# Patient Record
Sex: Male | Born: 1970 | Race: White | Hispanic: No | Smoking: Current every day smoker
Health system: Southern US, Community
[De-identification: ages and names within clinical notes are randomized; demographics above are authoritative.]

---

## 2002-01-01 ENCOUNTER — Encounter: Payer: Self-pay | Admitting: Emergency Medicine

## 2002-01-01 ENCOUNTER — Emergency Department (HOSPITAL_COMMUNITY): Admission: EM | Admit: 2002-01-01 | Discharge: 2002-01-01 | Payer: Self-pay | Admitting: Emergency Medicine

## 2005-05-25 ENCOUNTER — Observation Stay (HOSPITAL_COMMUNITY): Admission: EM | Admit: 2005-05-25 | Discharge: 2005-05-26 | Payer: Self-pay | Admitting: Emergency Medicine

## 2005-05-25 ENCOUNTER — Ambulatory Visit: Payer: Self-pay | Admitting: Internal Medicine

## 2005-05-26 ENCOUNTER — Ambulatory Visit: Payer: Self-pay | Admitting: Cardiovascular Disease

## 2005-06-04 ENCOUNTER — Ambulatory Visit: Payer: Self-pay | Admitting: Internal Medicine

## 2015-12-04 ENCOUNTER — Encounter: Payer: Self-pay | Admitting: Family Medicine

## 2017-01-27 DIAGNOSIS — Z6829 Body mass index (BMI) 29.0-29.9, adult: Secondary | ICD-10-CM | POA: Diagnosis not present

## 2017-01-27 DIAGNOSIS — F438 Other reactions to severe stress: Secondary | ICD-10-CM | POA: Diagnosis not present

## 2017-01-27 DIAGNOSIS — F988 Other specified behavioral and emotional disorders with onset usually occurring in childhood and adolescence: Secondary | ICD-10-CM | POA: Diagnosis not present

## 2017-06-06 DIAGNOSIS — Z13220 Encounter for screening for lipoid disorders: Secondary | ICD-10-CM | POA: Diagnosis not present

## 2017-06-06 DIAGNOSIS — Z0000 Encounter for general adult medical examination without abnormal findings: Secondary | ICD-10-CM | POA: Diagnosis not present

## 2017-06-06 DIAGNOSIS — E669 Obesity, unspecified: Secondary | ICD-10-CM | POA: Diagnosis not present

## 2017-06-06 DIAGNOSIS — Z131 Encounter for screening for diabetes mellitus: Secondary | ICD-10-CM | POA: Diagnosis not present

## 2017-06-06 DIAGNOSIS — Z6831 Body mass index (BMI) 31.0-31.9, adult: Secondary | ICD-10-CM | POA: Diagnosis not present

## 2017-06-16 DIAGNOSIS — R21 Rash and other nonspecific skin eruption: Secondary | ICD-10-CM | POA: Diagnosis not present

## 2017-06-16 DIAGNOSIS — A932 Colorado tick fever: Secondary | ICD-10-CM | POA: Diagnosis not present

## 2018-01-04 DIAGNOSIS — Z6830 Body mass index (BMI) 30.0-30.9, adult: Secondary | ICD-10-CM | POA: Diagnosis not present

## 2018-01-04 DIAGNOSIS — Z23 Encounter for immunization: Secondary | ICD-10-CM | POA: Diagnosis not present

## 2018-01-04 DIAGNOSIS — E669 Obesity, unspecified: Secondary | ICD-10-CM | POA: Diagnosis not present

## 2018-01-04 DIAGNOSIS — F988 Other specified behavioral and emotional disorders with onset usually occurring in childhood and adolescence: Secondary | ICD-10-CM | POA: Diagnosis not present

## 2018-01-04 DIAGNOSIS — F438 Other reactions to severe stress: Secondary | ICD-10-CM | POA: Diagnosis not present

## 2018-01-04 DIAGNOSIS — F17200 Nicotine dependence, unspecified, uncomplicated: Secondary | ICD-10-CM | POA: Diagnosis not present

## 2018-03-25 DIAGNOSIS — J0100 Acute maxillary sinusitis, unspecified: Secondary | ICD-10-CM | POA: Diagnosis not present

## 2018-06-09 DIAGNOSIS — Z0000 Encounter for general adult medical examination without abnormal findings: Secondary | ICD-10-CM | POA: Diagnosis not present

## 2018-06-09 DIAGNOSIS — Z13220 Encounter for screening for lipoid disorders: Secondary | ICD-10-CM | POA: Diagnosis not present

## 2018-06-09 DIAGNOSIS — E663 Overweight: Secondary | ICD-10-CM | POA: Diagnosis not present

## 2018-06-09 DIAGNOSIS — Z131 Encounter for screening for diabetes mellitus: Secondary | ICD-10-CM | POA: Diagnosis not present

## 2018-06-09 DIAGNOSIS — Z6829 Body mass index (BMI) 29.0-29.9, adult: Secondary | ICD-10-CM | POA: Diagnosis not present

## 2018-07-21 DIAGNOSIS — M542 Cervicalgia: Secondary | ICD-10-CM | POA: Diagnosis not present

## 2021-03-05 DIAGNOSIS — Z720 Tobacco use: Secondary | ICD-10-CM | POA: Diagnosis not present

## 2021-03-30 DIAGNOSIS — H6692 Otitis media, unspecified, left ear: Secondary | ICD-10-CM | POA: Diagnosis not present

## 2021-03-30 DIAGNOSIS — J189 Pneumonia, unspecified organism: Secondary | ICD-10-CM | POA: Diagnosis not present

## 2021-04-15 DIAGNOSIS — J329 Chronic sinusitis, unspecified: Secondary | ICD-10-CM | POA: Diagnosis not present

## 2021-04-15 DIAGNOSIS — Z20828 Contact with and (suspected) exposure to other viral communicable diseases: Secondary | ICD-10-CM | POA: Diagnosis not present

## 2021-04-15 DIAGNOSIS — J40 Bronchitis, not specified as acute or chronic: Secondary | ICD-10-CM | POA: Diagnosis not present

## 2021-04-15 DIAGNOSIS — I10 Essential (primary) hypertension: Secondary | ICD-10-CM | POA: Diagnosis not present

## 2021-11-29 DIAGNOSIS — R1031 Right lower quadrant pain: Secondary | ICD-10-CM | POA: Diagnosis not present

## 2021-11-29 DIAGNOSIS — R1084 Generalized abdominal pain: Secondary | ICD-10-CM | POA: Diagnosis not present

## 2021-12-03 ENCOUNTER — Encounter: Payer: Self-pay | Admitting: Emergency Medicine

## 2021-12-03 ENCOUNTER — Ambulatory Visit
Admission: EM | Admit: 2021-12-03 | Discharge: 2021-12-03 | Disposition: A | Discharge status: Home / Self Care | Payer: BC Managed Care – PPO | Attending: Emergency Medicine | Admitting: Emergency Medicine

## 2021-12-03 ENCOUNTER — Ambulatory Visit (INDEPENDENT_AMBULATORY_CARE_PROVIDER_SITE_OTHER): Payer: BC Managed Care – PPO

## 2021-12-03 DIAGNOSIS — R1031 Right lower quadrant pain: Secondary | ICD-10-CM | POA: Insufficient documentation

## 2021-12-03 DIAGNOSIS — R109 Unspecified abdominal pain: Secondary | ICD-10-CM | POA: Diagnosis not present

## 2021-12-03 LAB — COMPREHENSIVE METABOLIC PANEL
ALT: 28 U/L (ref 0–44)
AST: 21 U/L (ref 15–41)
Albumin: 4.7 g/dL (ref 3.5–5.0)
Alkaline Phosphatase: 63 U/L (ref 38–126)
Anion gap: 6 (ref 5–15)
BUN: 13 mg/dL (ref 6–20)
CO2: 27 mmol/L (ref 22–32)
Calcium: 9.5 mg/dL (ref 8.9–10.3)
Chloride: 106 mmol/L (ref 98–111)
Creatinine, Ser: 1.02 mg/dL (ref 0.61–1.24)
GFR, Estimated: >60 mL/min (ref >60)
Glucose, Bld: 106 mg/dL — ABNORMAL HIGH (ref 70–99)
Potassium: 4.6 mmol/L (ref 3.5–5.1)
Sodium: 139 mmol/L (ref 135–145)
Total Bilirubin: 0.5 mg/dL (ref 0.3–1.2)
Total Protein: 8 g/dL (ref 6.5–8.1)

## 2021-12-03 LAB — CBC WITH DIFFERENTIAL/PLATELET
Abs Immature Granulocytes: 0.02 10*3/uL (ref 0.00–0.07)
Basophils Absolute: 0.1 10*3/uL (ref 0.0–0.1)
Basophils Relative: 1 %
Eosinophils Absolute: 0.1 10*3/uL (ref 0.0–0.5)
Eosinophils Relative: 1 %
HCT: 50.2 % (ref 39.0–52.0)
Hemoglobin: 16.7 g/dL (ref 13.0–17.0)
Immature Granulocytes: 0 %
Lymphocytes Relative: 29 %
Lymphs Abs: 2.7 10*3/uL (ref 0.7–4.0)
MCH: 27.8 pg (ref 26.0–34.0)
MCHC: 33.3 g/dL (ref 30.0–36.0)
MCV: 83.5 fL (ref 80.0–100.0)
Monocytes Absolute: 0.6 10*3/uL (ref 0.1–1.0)
Monocytes Relative: 7 %
Neutro Abs: 5.7 10*3/uL (ref 1.7–7.7)
Neutrophils Relative %: 62 %
Platelets: 349 10*3/uL (ref 150–400)
RBC: 6.01 MIL/uL — ABNORMAL HIGH (ref 4.22–5.81)
RDW: 14.3 % (ref 11.5–15.5)
WBC: 9.1 10*3/uL (ref 4.0–10.5)
nRBC: 0 % (ref 0.0–0.2)

## 2021-12-03 LAB — URINALYSIS, ROUTINE W REFLEX MICROSCOPIC
Bilirubin Urine: NEGATIVE
Glucose, UA: NEGATIVE mg/dL
Hgb urine dipstick: NEGATIVE
Ketones, ur: NEGATIVE mg/dL
Leukocytes,Ua: NEGATIVE
Nitrite: NEGATIVE
Protein, ur: NEGATIVE mg/dL
Specific Gravity, Urine: 1.015 (ref 1.005–1.030)
pH: 7 (ref 5.0–8.0)

## 2021-12-03 MED ORDER — IBUPROFEN 600 MG PO TABS: 600.0000 mg | ORAL_TABLET | Freq: Four times a day (QID) | ORAL | 0 refills | Status: AC | PRN

## 2021-12-03 MED ORDER — TRAMADOL HCL 50 MG PO TABS: 100.0000 mg | ORAL_TABLET | Freq: Four times a day (QID) | ORAL | 0 refills | Status: AC | PRN

## 2021-12-03 MED ORDER — IOHEXOL 300 MG/ML  SOLN
100.0000 mL | Freq: Once | INTRAMUSCULAR | Status: AC | PRN
  Administered 2021-12-03: 100 mL via INTRAVENOUS

## 2021-12-03 NOTE — ED Triage Notes (Signed)
Pt presents with RLQ pain for 2 weeks. He went to an urgent care in [place] and advised they will contact him about his bloodwork. He received a call that his calcium levels were elevated. He is here to see what he should do.

## 2021-12-03 NOTE — Discharge Instructions (Addendum)
Your blood work and CT scan were very reassuring.  The CT scan did show that you have scattered colonic diverticulosis but no active infection or diverticulitis.  It does show that your appendix is normal.  No other inflammatory process was noted within your abdomen and all of your organ structures look normal.  You may be experiencing pain as a result of adhesions from your previous back surgery.  These are difficult to detect on CT scan unless they are causing an issue, such as a bowel obstruction, which they are not at present.  Use the 600 mg ibuprofen I am prescribing every 6 hours as needed for pain.  Take it with food.  If you have severe pain you can take the tramadol that I prescribed.  Be mindful that this will make you drowsy so do not operate heavy machinery or drive if you take it.  Also do not drink alcohol if you take it.  If your symptoms continue I would follow-up with your primary care provider and ask for referral to general surgery to be evaluated for possible adhesions.

## 2021-12-03 NOTE — ED Provider Notes (Signed)
[facility]    CSN: [id number] Arrival date & time: 12/03/21  [id number]      History   Chief Complaint Chief Complaint  Patient presents with   Abdominal Pain    HPI Hunter Gentry is a 51 y.o. male.   HPI  51 year old male here for evaluation right lower quadrant abdominal pain.  The patient reports that he has been experiencing pain in the right lower quadrant of his abdomen for the last 2 weeks.  It initially started as an off-and-on pain which originally woke him from sleep.  It is since going into a constant sharp pain.  This is associated with intermittent nausea he is also had intermittent dysuria.  He denies any fever, changes to appetite, vomiting, urinary urgency or frequency, hematuria, or changes to his urine stream.  He still has his appendix.  He has had a back fusion surgery in the past but no other surgical procedures.  He was previously evaluated at an urgent care in [place] and had a CMP that showed an elevated calcium.  These labs are not available in epic.  History reviewed. No pertinent past medical history.  There are no problems to display for this patient.   History reviewed. No pertinent surgical history.     Home Medications    Prior to Admission medications   Medication Sig Start Date End Date Taking? Authorizing Provider  ibuprofen (ADVIL) 600 MG tablet Take 1 tablet (600 mg total) by mouth every 6 (six) hours as needed. 12/03/21  Yes Margarette Canada, NP  traMADol (ULTRAM) 50 MG tablet Take 2 tablets (100 mg total) by mouth every 6 (six) hours as needed. 12/03/21  Yes Margarette Canada, NP    Family History History reviewed. No pertinent family history.  Social History Social History   Tobacco Use   Smoking status: Every Day    Types: Cigarettes   Smokeless tobacco: Never  Vaping Use   Vaping Use: Never used  Substance Use Topics   Alcohol use: Never   Drug use: Never     Allergies   Patient has no known  allergies.   Review of Systems Review of Systems  Constitutional:  Negative for fever.  Gastrointestinal:  Positive for abdominal pain and nausea. Negative for diarrhea and vomiting.  Genitourinary:  Positive for dysuria. Negative for decreased urine volume, difficulty urinating, frequency and urgency.  Musculoskeletal:  Positive for back pain.  Skin:  Negative for rash.  Hematological: Negative.   Psychiatric/Behavioral: Negative.       Physical Exam Triage Vital Signs ED Triage Vitals  Enc Vitals Group     BP      Pulse      Resp      Temp      Temp src      SpO2      Weight      Height      Head Circumference      Peak Flow      Pain Score      Pain Loc      Pain Edu?      Excl. in [place]?    No data found.  Updated Vital Signs BP (!) 155/109 (BP Location: Right Arm)   Pulse 96   Temp 98.4 F (36.9 C) (Oral)   Resp 18   SpO2 96%   Visual Acuity Right Eye Distance:   Left Eye Distance:   Bilateral Distance:    Right Eye Near:   Left Eye  Near:    Bilateral Near:     Physical Exam Vitals and nursing note reviewed.  Constitutional:      Appearance: Normal appearance.  HENT:     Head: Normocephalic and atraumatic.  Cardiovascular:     Rate and Rhythm: Normal rate and regular rhythm.     Pulses: Normal pulses.     Heart sounds: Normal heart sounds. No murmur heard.    No friction rub. No gallop.  Pulmonary:     Effort: Pulmonary effort is normal.     Breath sounds: Normal breath sounds. No wheezing, rhonchi or rales.  Abdominal:     General: Abdomen is flat. Bowel sounds are normal.     Palpations: Abdomen is soft.     Tenderness: There is abdominal tenderness. There is no right CVA tenderness, left CVA tenderness, guarding or rebound.     Hernia: A hernia is present.     Comments: Patient has right lower quadrant abdominal pain with tenderness over McBurney's point.  No rebound tenderness or guarding.  He also has a small umbilical hernia.  Skin:     General: Skin is warm and dry.     Capillary Refill: Capillary refill takes less than 2 seconds.     Findings: No erythema or rash.  Neurological:     General: No focal deficit present.     Mental Status: He is alert and oriented to person, place, and time.  Psychiatric:        Mood and Affect: Mood normal.        Behavior: Behavior normal.        Thought Content: Thought content normal.        Judgment: Judgment normal.      UC Treatments / Results  Labs (all labs ordered are listed, but only abnormal results are displayed) Labs Reviewed  CBC WITH DIFFERENTIAL/PLATELET - Abnormal; Notable for the following components:      Result Value   RBC 6.01 (*)    All other components within normal limits  COMPREHENSIVE METABOLIC PANEL - Abnormal; Notable for the following components:   Glucose, Bld 106 (*)    All other components within normal limits  URINALYSIS, ROUTINE W REFLEX MICROSCOPIC    EKG   Radiology CT ABDOMEN PELVIS W CONTRAST  Result Date: 12/03/2021 CLINICAL DATA:  RLQ abdominal pain (Age >= 14y) EXAM: CT ABDOMEN AND PELVIS WITH CONTRAST TECHNIQUE: Multidetector CT imaging of the abdomen and pelvis was performed using the standard protocol following bolus administration of intravenous contrast. RADIATION DOSE REDUCTION: This exam was performed according to the departmental dose-optimization program which includes automated exposure control, adjustment of the mA and/or kV according to patient size and/or use of iterative reconstruction technique. CONTRAST:  170mL OMNIPAQUE IOHEXOL 300 MG/ML  SOLN COMPARISON:  None Available. FINDINGS: Lower chest: Bibasilar linear atelectasis/scarring. No acute findings. Hepatobiliary: Unchanged right hepatic cyst. No new liver abnormality. The gallbladder is unremarkable. Pancreas: Unremarkable. No pancreatic ductal dilatation or surrounding inflammatory changes. Spleen: Small subcentimeter hypodensity in the anterior upper spleen, likely  small cyst or hemangioma. Adrenals/Urinary Tract: Adrenal glands are unremarkable. No hydronephrosis or nephrolithiasis. The bladder is mildly distended. Stomach/Bowel: The stomach is within normal limits. There is no evidence of bowel obstruction.The appendix is normal. Scattered colonic diverticula most prominent in the sigmoid. Vascular/Lymphatic: Aortoiliac atherosclerosis. No AAA. No lymphadenopathy. Reproductive: Unremarkable. Other: Small fat containing umbilical hernia. No bowel containing hernia. No ascites. No free air. Musculoskeletal: No acute osseous abnormality. No suspicious osseous  lesion. Prior anterior posterior fusion from L4-S1. IMPRESSION: No acute findings in the abdomen or pelvis.  Normal appendix. Colonic diverticulosis.  No acute diverticulitis. Electronically Signed   By: Maurine Simmering M.D.   On: 12/03/2021 10:29    Procedures Procedures (including critical care time)  Medications Ordered in UC Medications  iohexol (OMNIPAQUE) 300 MG/ML solution 100 mL (100 mLs Intravenous Contrast Given 12/03/21 1013)    Initial Impression / Assessment and Plan / UC Course  I have reviewed the triage vital signs and the nursing notes.  Pertinent labs & imaging results that were available during my care of the patient were reviewed by me and considered in my medical decision making (see chart for details).   Patient is a pleasant, nontoxic-appearing 51 year old male presenting for evaluation of 2 weeks worth of right lower quadrant abdominal pain that is associated with intermittent nausea and also intermittent dysuria.  He describes the pain as sharp and states that it radiates through to his back.  He denies any changes to appetite or vomiting.  He was evaluated previously at an urgent care and [place] that did some blood work which showed an elevated calcium.  These results are not available to view in epic.  His exam does reveal tenderness over McBurney's point in the right lower quadrant  of his abdomen but no guarding or rebound.  Given the constant nature and duration there is definite concern for appendicitis.  I will check a CBC, CMP to reevaluate calcium level, urinalysis to evaluate the dysuria, and obtain a CT of the abdomen pelvis to look for acute intra-abdominal pathology such as appendicitis.  CMP does not show any evidence of hypercalcemia as patient's calcium is 9.5.  Renal function is also normal with a BUN of 13 and a creatinine of 1.02.  Transaminases are unremarkable.  No evidence of electrolyte abnormality as the sodium is normal at 139, potassium is normal at 4.6, chloride is normal at 106.  Patient does have a mildly elevated glucose of 106 but he did eat breakfast this morning.  Urinalysis is unremarkable.  CBC shows a normal white count of 9.1.  RBC count is elevated at 6.01 but H&H is normal at 16.7 and 50.2 respectively.  MCV, MCH, and MCHC are all normal.  Platelets are normal at 349.  Differential is unremarkable.  Radiology impression states no acute findings in the abdomen or pelvis.  Normal appendix.  There is colonic diverticulosis which is most prominent in the sigmoid but no acute diverticulitis.  Etiology of the patient's abdominal pain is not clear given the lack of findings on the CT of the abdomen pelvis.  He does have a prior fusion of L4-S1.  Patient's pain may be related to adhesions.  I will discharge patient home on 600 mg of ibuprofen that he can take with food every 6 hours as needed.  If his pain continues I will have him follow-up with his primary care doctor and get a referral to general surgery to be evaluated for possible abdominal adhesions.  I will also give a short prescription for tramadol that patient can use as needed for severe pain.  He does not have any open prescription in PDMP.   Final Clinical Impressions(s) / UC Diagnoses   Final diagnoses:  Right lower quadrant abdominal pain     Discharge Instructions      Your  blood work and CT scan were very reassuring.  The CT scan did show that you have scattered  colonic diverticulosis but no active infection or diverticulitis.  It does show that your appendix is normal.  No other inflammatory process was noted within your abdomen and all of your organ structures look normal.  You may be experiencing pain as a result of adhesions from your previous back surgery.  These are difficult to detect on CT scan unless they are causing an issue, such as a bowel obstruction, which they are not at present.  Use the 600 mg ibuprofen I am prescribing every 6 hours as needed for pain.  Take it with food.  If you have severe pain you can take the tramadol that I prescribed.  Be mindful that this will make you drowsy so do not operate heavy machinery or drive if you take it.  Also do not drink alcohol if you take it.  If your symptoms continue I would follow-up with your primary care provider and ask for referral to general surgery to be evaluated for possible adhesions.     ED Prescriptions     Medication Sig Dispense Auth. Provider   ibuprofen (ADVIL) 600 MG tablet Take 1 tablet (600 mg total) by mouth every 6 (six) hours as needed. 30 tablet Margarette Canada, NP   traMADol (ULTRAM) 50 MG tablet Take 2 tablets (100 mg total) by mouth every 6 (six) hours as needed. 15 tablet Margarette Canada, NP      I have reviewed the PDMP during this encounter.   Margarette Canada, NP 12/03/21 1041

## 2022-01-15 DIAGNOSIS — Z0000 Encounter for general adult medical examination without abnormal findings: Secondary | ICD-10-CM | POA: Diagnosis not present

## 2022-01-15 DIAGNOSIS — E669 Obesity, unspecified: Secondary | ICD-10-CM | POA: Diagnosis not present

## 2022-01-15 DIAGNOSIS — E785 Hyperlipidemia, unspecified: Secondary | ICD-10-CM | POA: Diagnosis not present

## 2022-01-15 DIAGNOSIS — Z6829 Body mass index (BMI) 29.0-29.9, adult: Secondary | ICD-10-CM | POA: Diagnosis not present

## 2022-01-15 DIAGNOSIS — Z131 Encounter for screening for diabetes mellitus: Secondary | ICD-10-CM | POA: Diagnosis not present

## 2023-08-15 DIAGNOSIS — F419 Anxiety disorder, unspecified: Secondary | ICD-10-CM | POA: Diagnosis not present

## 2023-08-15 DIAGNOSIS — R079 Chest pain, unspecified: Secondary | ICD-10-CM | POA: Diagnosis not present

## 2023-08-15 DIAGNOSIS — I10 Essential (primary) hypertension: Secondary | ICD-10-CM | POA: Diagnosis not present

## 2023-08-15 DIAGNOSIS — R0789 Other chest pain: Secondary | ICD-10-CM | POA: Diagnosis not present

## 2023-08-15 DIAGNOSIS — R0600 Dyspnea, unspecified: Secondary | ICD-10-CM | POA: Diagnosis not present

## 2023-08-15 DIAGNOSIS — F17210 Nicotine dependence, cigarettes, uncomplicated: Secondary | ICD-10-CM | POA: Diagnosis not present

## 2023-08-25 DIAGNOSIS — F988 Other specified behavioral and emotional disorders with onset usually occurring in childhood and adolescence: Secondary | ICD-10-CM | POA: Diagnosis not present

## 2023-08-25 DIAGNOSIS — R0989 Other specified symptoms and signs involving the circulatory and respiratory systems: Secondary | ICD-10-CM | POA: Diagnosis not present

## 2023-08-25 DIAGNOSIS — F4389 Other reactions to severe stress: Secondary | ICD-10-CM | POA: Diagnosis not present

## 2023-08-25 DIAGNOSIS — Z6829 Body mass index (BMI) 29.0-29.9, adult: Secondary | ICD-10-CM | POA: Diagnosis not present

## 2023-12-29 DIAGNOSIS — I10 Essential (primary) hypertension: Secondary | ICD-10-CM | POA: Diagnosis not present

## 2024-01-16 DIAGNOSIS — F4389 Other reactions to severe stress: Secondary | ICD-10-CM | POA: Diagnosis not present

## 2024-01-16 DIAGNOSIS — R0989 Other specified symptoms and signs involving the circulatory and respiratory systems: Secondary | ICD-10-CM | POA: Diagnosis not present

## 2024-01-16 DIAGNOSIS — Z6832 Body mass index (BMI) 32.0-32.9, adult: Secondary | ICD-10-CM | POA: Diagnosis not present
